# Patient Record
Sex: Female | Born: 2000
Health system: Southern US, Community
[De-identification: ages and names within clinical notes are randomized; demographics above are authoritative.]

---
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
## 2020-05-11 DIAGNOSIS — Z3009 Encounter for other general counseling and advice on contraception: Secondary | ICD-10-CM | POA: Diagnosis not present

## 2021-01-03 DIAGNOSIS — Z309 Encounter for contraceptive management, unspecified: Secondary | ICD-10-CM | POA: Diagnosis not present

## 2021-01-03 DIAGNOSIS — Z30432 Encounter for removal of intrauterine contraceptive device: Secondary | ICD-10-CM | POA: Diagnosis not present

## 2021-03-23 DIAGNOSIS — R6889 Other general symptoms and signs: Secondary | ICD-10-CM | POA: Diagnosis not present

## 2021-03-23 DIAGNOSIS — L659 Nonscarring hair loss, unspecified: Secondary | ICD-10-CM | POA: Diagnosis not present

## 2021-03-28 DIAGNOSIS — B350 Tinea barbae and tinea capitis: Secondary | ICD-10-CM | POA: Diagnosis not present

## 2021-03-28 DIAGNOSIS — L638 Other alopecia areata: Secondary | ICD-10-CM | POA: Diagnosis not present

## 2021-04-24 DIAGNOSIS — L659 Nonscarring hair loss, unspecified: Secondary | ICD-10-CM | POA: Diagnosis not present

## 2021-04-24 DIAGNOSIS — F419 Anxiety disorder, unspecified: Secondary | ICD-10-CM | POA: Diagnosis not present

## 2021-04-24 DIAGNOSIS — R5383 Other fatigue: Secondary | ICD-10-CM | POA: Diagnosis not present

## 2021-04-24 DIAGNOSIS — E559 Vitamin D deficiency, unspecified: Secondary | ICD-10-CM | POA: Diagnosis not present

## 2021-04-24 DIAGNOSIS — R5381 Other malaise: Secondary | ICD-10-CM | POA: Diagnosis not present

## 2021-04-26 ENCOUNTER — Emergency Department (HOSPITAL_COMMUNITY)
Admission: EM | Admit: 2021-04-26 | Discharge: 2021-04-26 | Discharge status: Against Medical Advice | Payer: BC Managed Care – PPO | Attending: Emergency Medicine | Admitting: Emergency Medicine

## 2021-04-26 ENCOUNTER — Encounter (HOSPITAL_COMMUNITY): Payer: Self-pay | Admitting: Emergency Medicine

## 2021-04-26 DIAGNOSIS — Y92410 Unspecified street and highway as the place of occurrence of the external cause: Secondary | ICD-10-CM | POA: Insufficient documentation

## 2021-04-26 DIAGNOSIS — S8992XA Unspecified injury of left lower leg, initial encounter: Secondary | ICD-10-CM | POA: Diagnosis not present

## 2021-04-26 DIAGNOSIS — S8012XA Contusion of left lower leg, initial encounter: Secondary | ICD-10-CM | POA: Insufficient documentation
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
## 2021-05-28 DIAGNOSIS — L638 Other alopecia areata: Secondary | ICD-10-CM | POA: Diagnosis not present

## 2021-07-09 DIAGNOSIS — L638 Other alopecia areata: Secondary | ICD-10-CM | POA: Diagnosis not present

## 2021-07-28 DIAGNOSIS — R110 Nausea: Secondary | ICD-10-CM | POA: Diagnosis not present

## 2021-07-28 DIAGNOSIS — R109 Unspecified abdominal pain: Secondary | ICD-10-CM | POA: Diagnosis not present

## 2021-07-28 DIAGNOSIS — R197 Diarrhea, unspecified: Secondary | ICD-10-CM | POA: Diagnosis not present

## 2021-11-20 ENCOUNTER — Other Ambulatory Visit: Payer: Self-pay

## 2021-11-20 ENCOUNTER — Emergency Department (HOSPITAL_COMMUNITY): Payer: BC Managed Care – PPO

## 2021-11-20 ENCOUNTER — Encounter (HOSPITAL_COMMUNITY): Payer: Self-pay

## 2021-11-20 ENCOUNTER — Emergency Department (HOSPITAL_COMMUNITY)
Admission: EM | Admit: 2021-11-20 | Discharge: 2021-11-20 | Disposition: A | Discharge status: Home / Self Care | Payer: BC Managed Care – PPO | Attending: Emergency Medicine | Admitting: Emergency Medicine

## 2021-11-20 DIAGNOSIS — M25511 Pain in right shoulder: Secondary | ICD-10-CM | POA: Diagnosis not present

## 2021-11-20 DIAGNOSIS — M542 Cervicalgia: Secondary | ICD-10-CM | POA: Insufficient documentation

## 2021-11-20 DIAGNOSIS — R519 Headache, unspecified: Secondary | ICD-10-CM | POA: Diagnosis not present

## 2021-11-20 DIAGNOSIS — Y92410 Unspecified street and highway as the place of occurrence of the external cause: Secondary | ICD-10-CM | POA: Insufficient documentation

## 2021-11-20 DIAGNOSIS — S060X1A Concussion with loss of consciousness of 30 minutes or less, initial encounter: Secondary | ICD-10-CM | POA: Diagnosis not present

## 2021-11-20 DIAGNOSIS — S0990XA Unspecified injury of head, initial encounter: Secondary | ICD-10-CM | POA: Diagnosis not present

## 2021-11-20 LAB — I-STAT BETA HCG BLOOD, ED (MC, WL, AP ONLY): I-stat hCG, quantitative: <5 m[IU]/mL (ref <5)
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
## 2021-11-20 NOTE — ED Notes (Signed)
Pt reports headache, left leg pain, and right shoulder pain. She denies blurred vision/N/V.  ?

## 2021-11-26 DIAGNOSIS — S060X9A Concussion with loss of consciousness of unspecified duration, initial encounter: Secondary | ICD-10-CM | POA: Diagnosis not present

## 2021-11-26 DIAGNOSIS — X58XXXA Exposure to other specified factors, initial encounter: Secondary | ICD-10-CM | POA: Diagnosis not present

## 2021-12-28 DIAGNOSIS — F4311 Post-traumatic stress disorder, acute: Secondary | ICD-10-CM | POA: Diagnosis not present

## 2022-01-18 DIAGNOSIS — F4311 Post-traumatic stress disorder, acute: Secondary | ICD-10-CM | POA: Diagnosis not present

## 2022-01-22 DIAGNOSIS — F4311 Post-traumatic stress disorder, acute: Secondary | ICD-10-CM | POA: Diagnosis not present

## 2022-01-29 DIAGNOSIS — F4311 Post-traumatic stress disorder, acute: Secondary | ICD-10-CM | POA: Diagnosis not present

## 2022-02-05 DIAGNOSIS — F4311 Post-traumatic stress disorder, acute: Secondary | ICD-10-CM | POA: Diagnosis not present

## 2022-04-08 DIAGNOSIS — Z0000 Encounter for general adult medical examination without abnormal findings: Secondary | ICD-10-CM | POA: Diagnosis not present

## 2022-04-22 DIAGNOSIS — Z01419 Encounter for gynecological examination (general) (routine) without abnormal findings: Secondary | ICD-10-CM | POA: Diagnosis not present

## 2022-04-22 DIAGNOSIS — Z113 Encounter for screening for infections with a predominantly sexual mode of transmission: Secondary | ICD-10-CM | POA: Diagnosis not present

## 2022-04-22 DIAGNOSIS — Z309 Encounter for contraceptive management, unspecified: Secondary | ICD-10-CM | POA: Diagnosis not present

## 2022-08-07 DIAGNOSIS — J029 Acute pharyngitis, unspecified: Secondary | ICD-10-CM | POA: Diagnosis not present
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
## 2023-01-02 DIAGNOSIS — Z30014 Encounter for initial prescription of intrauterine contraceptive device: Secondary | ICD-10-CM | POA: Diagnosis not present

## 2023-02-13 DIAGNOSIS — Z30430 Encounter for insertion of intrauterine contraceptive device: Secondary | ICD-10-CM | POA: Diagnosis not present

## 2023-03-22 DIAGNOSIS — H5711 Ocular pain, right eye: Secondary | ICD-10-CM | POA: Diagnosis not present

## 2023-03-22 DIAGNOSIS — H00011 Hordeolum externum right upper eyelid: Secondary | ICD-10-CM | POA: Diagnosis not present

## 2023-04-02 DIAGNOSIS — Z30431 Encounter for routine checking of intrauterine contraceptive device: Secondary | ICD-10-CM | POA: Diagnosis not present

## 2023-04-14 DIAGNOSIS — Z30432 Encounter for removal of intrauterine contraceptive device: Secondary | ICD-10-CM | POA: Diagnosis not present

## 2023-12-16 DIAGNOSIS — R519 Headache, unspecified: Secondary | ICD-10-CM | POA: Diagnosis not present

## 2023-12-16 DIAGNOSIS — F17200 Nicotine dependence, unspecified, uncomplicated: Secondary | ICD-10-CM | POA: Diagnosis not present

## 2024-01-14 DIAGNOSIS — Z01419 Encounter for gynecological examination (general) (routine) without abnormal findings: Secondary | ICD-10-CM | POA: Diagnosis not present

## 2024-01-16 DIAGNOSIS — N912 Amenorrhea, unspecified: Secondary | ICD-10-CM | POA: Diagnosis not present

## 2024-01-20 DIAGNOSIS — Z3200 Encounter for pregnancy test, result unknown: Secondary | ICD-10-CM | POA: Diagnosis not present

## 2024-01-20 DIAGNOSIS — Z640 Problems related to unwanted pregnancy: Secondary | ICD-10-CM | POA: Diagnosis not present

## 2024-03-31 DIAGNOSIS — F4323 Adjustment disorder with mixed anxiety and depressed mood: Secondary | ICD-10-CM | POA: Diagnosis not present

## 2024-04-13 DIAGNOSIS — Z713 Dietary counseling and surveillance: Secondary | ICD-10-CM | POA: Diagnosis not present

## 2024-04-22 DIAGNOSIS — Z713 Dietary counseling and surveillance: Secondary | ICD-10-CM | POA: Diagnosis not present

## 2024-06-15 DIAGNOSIS — Z713 Dietary counseling and surveillance: Secondary | ICD-10-CM | POA: Diagnosis not present
